# Patient Record
Sex: Female | Born: 1990 | Race: Asian | Hispanic: No | Marital: Single | Smoking: Never smoker
Health system: Southern US, Community
[De-identification: ages and names within clinical notes are randomized; demographics above are authoritative.]

## PROBLEM LIST (undated history)

## (undated) DIAGNOSIS — O0930 Supervision of pregnancy with insufficient antenatal care, unspecified trimester: Secondary | ICD-10-CM

## (undated) DIAGNOSIS — R51 Headache: Secondary | ICD-10-CM

## (undated) HISTORY — PX: NO PAST SURGERIES: SHX2092

## (undated) HISTORY — DX: Supervision of pregnancy with insufficient antenatal care, unspecified trimester: O09.30

## (undated) HISTORY — DX: Headache: R51

---
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
## 2011-01-01 ENCOUNTER — Encounter (HOSPITAL_COMMUNITY): Payer: Self-pay | Admitting: *Deleted

## 2011-01-01 ENCOUNTER — Telehealth (HOSPITAL_COMMUNITY): Payer: Self-pay | Admitting: *Deleted

## 2011-01-01 NOTE — Telephone Encounter (Signed)
Preadmission screen  

## 2011-01-03 ENCOUNTER — Inpatient Hospital Stay (HOSPITAL_COMMUNITY)
Admission: AD | Admit: 2011-01-03 | Discharge: 2011-01-05 | DRG: 775 | Disposition: A | Discharge status: Home / Self Care | Payer: Medicaid Other | Source: Ambulatory Visit | Attending: Obstetrics and Gynecology | Admitting: Obstetrics and Gynecology

## 2011-01-03 ENCOUNTER — Encounter (HOSPITAL_COMMUNITY): Payer: Self-pay | Admitting: Anesthesiology

## 2011-01-03 ENCOUNTER — Inpatient Hospital Stay (HOSPITAL_COMMUNITY): Payer: Medicaid Other | Admitting: Anesthesiology

## 2011-01-03 ENCOUNTER — Encounter (HOSPITAL_COMMUNITY): Payer: Self-pay | Admitting: Obstetrics and Gynecology

## 2011-01-03 ENCOUNTER — Encounter (HOSPITAL_COMMUNITY): Payer: Self-pay

## 2011-01-03 DIAGNOSIS — O99892 Other specified diseases and conditions complicating childbirth: Secondary | ICD-10-CM | POA: Diagnosis present

## 2011-01-03 DIAGNOSIS — Z22330 Carrier of Group B streptococcus: Secondary | ICD-10-CM

## 2011-01-03 DIAGNOSIS — Z3490 Encounter for supervision of normal pregnancy, unspecified, unspecified trimester: Secondary | ICD-10-CM

## 2011-01-03 LAB — CBC
HCT: 35 % — ABNORMAL LOW (ref 36.0–46.0)
Hemoglobin: 12 g/dL (ref 12.0–15.0)
MCV: 83.5 fL (ref 78.0–100.0)
Platelets: 159 10*3/uL (ref 150–400)
RBC: 4.19 MIL/uL (ref 3.87–5.11)
WBC: 17.9 10*3/uL — ABNORMAL HIGH (ref 4.0–10.5)

## 2011-01-03 MED ORDER — MISOPROSTOL 200 MCG PO TABS
ORAL_TABLET | ORAL | Status: AC
  Administered 2011-01-03: 800 ug via RECTAL
  Filled 2011-01-03: qty 4

## 2011-01-03 MED ORDER — ONDANSETRON HCL 4 MG PO TABS: 4.0000 mg | ORAL_TABLET | ORAL | Status: DC | PRN

## 2011-01-03 MED ORDER — PENICILLIN G POTASSIUM 5000000 UNITS IJ SOLR
5.0000 10*6.[IU] | Freq: Once | INTRAVENOUS | Status: AC
  Administered 2011-01-03: 5 10*6.[IU] via INTRAVENOUS
  Filled 2011-01-03: qty 5

## 2011-01-03 MED ORDER — IBUPROFEN 600 MG PO TABS
600.0000 mg | ORAL_TABLET | Freq: Four times a day (QID) | ORAL | Status: DC
  Administered 2011-01-03 – 2011-01-05 (×7): 600 mg via ORAL
  Filled 2011-01-03 (×7): qty 1

## 2011-01-03 MED ORDER — INFLUENZA VIRUS VACC SPLIT PF IM SUSP
0.5000 mL | INTRAMUSCULAR | Status: AC
  Administered 2011-01-04: 0.5 mL via INTRAMUSCULAR
  Filled 2011-01-03: qty 0.5

## 2011-01-03 MED ORDER — OXYTOCIN BOLUS FROM INFUSION
500.0000 mL | Freq: Once | INTRAVENOUS | Status: DC
  Filled 2011-01-03: qty 500

## 2011-01-03 MED ORDER — TETANUS-DIPHTH-ACELL PERTUSSIS 5-2.5-18.5 LF-MCG/0.5 IM SUSP: 0.5000 mL | Freq: Once | INTRAMUSCULAR | Status: DC

## 2011-01-03 MED ORDER — ZOLPIDEM TARTRATE 5 MG PO TABS: 5.0000 mg | ORAL_TABLET | Freq: Every evening | ORAL | Status: DC | PRN

## 2011-01-03 MED ORDER — IBUPROFEN 600 MG PO TABS: 600.0000 mg | ORAL_TABLET | Freq: Four times a day (QID) | ORAL | Status: DC | PRN

## 2011-01-03 MED ORDER — LACTATED RINGERS IV SOLN
INTRAVENOUS | Status: DC
  Administered 2011-01-03: 12:00:00 via INTRAVENOUS

## 2011-01-03 MED ORDER — PRENATAL PLUS 27-1 MG PO TABS
1.0000 | ORAL_TABLET | Freq: Every day | ORAL | Status: DC
  Administered 2011-01-03 – 2011-01-05 (×3): 1 via ORAL
  Filled 2011-01-03 (×3): qty 1

## 2011-01-03 MED ORDER — CITRIC ACID-SODIUM CITRATE 334-500 MG/5ML PO SOLN: 30.0000 mL | ORAL | Status: DC | PRN

## 2011-01-03 MED ORDER — FENTANYL 2.5 MCG/ML BUPIVACAINE 1/10 % EPIDURAL INFUSION (WH - ANES)
INTRAMUSCULAR | Status: DC | PRN
  Administered 2011-01-03: 12:00:00
  Administered 2011-01-03: 14 mL/h via EPIDURAL

## 2011-01-03 MED ORDER — BENZOCAINE-MENTHOL 20-0.5 % EX AERO
INHALATION_SPRAY | CUTANEOUS | Status: AC
  Administered 2011-01-03: 1 via TOPICAL
  Filled 2011-01-03: qty 56

## 2011-01-03 MED ORDER — ONDANSETRON HCL 4 MG/2ML IJ SOLN: 4.0000 mg | INTRAMUSCULAR | Status: DC | PRN

## 2011-01-03 MED ORDER — PENICILLIN G POTASSIUM 5000000 UNITS IJ SOLR
2.5000 10*6.[IU] | INTRAVENOUS | Status: DC
  Administered 2011-01-03: 2.5 10*6.[IU] via INTRAVENOUS
  Filled 2011-01-03 (×5): qty 2.5

## 2011-01-03 MED ORDER — BENZOCAINE-MENTHOL 20-0.5 % EX AERO
1.0000 "application " | INHALATION_SPRAY | CUTANEOUS | Status: DC | PRN
  Administered 2011-01-03: 1 via TOPICAL

## 2011-01-03 MED ORDER — OXYCODONE-ACETAMINOPHEN 5-325 MG PO TABS: 2.0000 | ORAL_TABLET | ORAL | Status: DC | PRN

## 2011-01-03 MED ORDER — ACETAMINOPHEN 325 MG PO TABS: 650.0000 mg | ORAL_TABLET | ORAL | Status: DC | PRN

## 2011-01-03 MED ORDER — FENTANYL 2.5 MCG/ML BUPIVACAINE 1/10 % EPIDURAL INFUSION (WH - ANES)
14.0000 mL/h | INTRAMUSCULAR | Status: DC
  Filled 2011-01-03 (×2): qty 60

## 2011-01-03 MED ORDER — PHENYLEPHRINE 40 MCG/ML (10ML) SYRINGE FOR IV PUSH (FOR BLOOD PRESSURE SUPPORT)
80.0000 ug | PREFILLED_SYRINGE | INTRAVENOUS | Status: DC | PRN
  Filled 2011-01-03: qty 5

## 2011-01-03 MED ORDER — SIMETHICONE 80 MG PO CHEW: 80.0000 mg | CHEWABLE_TABLET | ORAL | Status: DC | PRN

## 2011-01-03 MED ORDER — DIPHENHYDRAMINE HCL 50 MG/ML IJ SOLN: 12.5000 mg | INTRAMUSCULAR | Status: DC | PRN

## 2011-01-03 MED ORDER — EPHEDRINE 5 MG/ML INJ
10.0000 mg | INTRAVENOUS | Status: DC | PRN
  Filled 2011-01-03 (×2): qty 4

## 2011-01-03 MED ORDER — LIDOCAINE HCL 1.5 % IJ SOLN
INTRAMUSCULAR | Status: DC | PRN
  Administered 2011-01-03 (×2): 5 mL via EPIDURAL

## 2011-01-03 MED ORDER — EPHEDRINE 5 MG/ML INJ
10.0000 mg | INTRAVENOUS | Status: DC | PRN
  Filled 2011-01-03: qty 4

## 2011-01-03 MED ORDER — LACTATED RINGERS IV SOLN
INTRAVENOUS | Status: DC
  Administered 2011-01-03: 19:00:00 via INTRAVENOUS

## 2011-01-03 MED ORDER — LANOLIN HYDROUS EX OINT: TOPICAL_OINTMENT | CUTANEOUS | Status: DC | PRN

## 2011-01-03 MED ORDER — LACTATED RINGERS IV SOLN
500.0000 mL | INTRAVENOUS | Status: DC | PRN
  Administered 2011-01-03: 1000 mL via INTRAVENOUS

## 2011-01-03 MED ORDER — PHENYLEPHRINE 40 MCG/ML (10ML) SYRINGE FOR IV PUSH (FOR BLOOD PRESSURE SUPPORT)
80.0000 ug | PREFILLED_SYRINGE | INTRAVENOUS | Status: DC | PRN
  Filled 2011-01-03 (×2): qty 5

## 2011-01-03 MED ORDER — DIBUCAINE 1 % RE OINT: 1.0000 "application " | TOPICAL_OINTMENT | RECTAL | Status: DC | PRN

## 2011-01-03 MED ORDER — ONDANSETRON HCL 4 MG/2ML IJ SOLN: 4.0000 mg | Freq: Four times a day (QID) | INTRAMUSCULAR | Status: DC | PRN

## 2011-01-03 MED ORDER — LIDOCAINE HCL (PF) 1 % IJ SOLN
30.0000 mL | INTRAMUSCULAR | Status: DC | PRN
  Filled 2011-01-03: qty 30

## 2011-01-03 MED ORDER — TERBUTALINE SULFATE 1 MG/ML IJ SOLN: 0.2500 mg | Freq: Once | INTRAMUSCULAR | Status: DC | PRN

## 2011-01-03 MED ORDER — BUTORPHANOL TARTRATE 2 MG/ML IJ SOLN
2.0000 mg | INTRAMUSCULAR | Status: DC | PRN
  Administered 2011-01-03: 2 mg via INTRAVENOUS
  Filled 2011-01-03: qty 1

## 2011-01-03 MED ORDER — LACTATED RINGERS IV SOLN: 500.0000 mL | Freq: Once | INTRAVENOUS | Status: DC

## 2011-01-03 MED ORDER — DIPHENHYDRAMINE HCL 25 MG PO CAPS: 25.0000 mg | ORAL_CAPSULE | Freq: Four times a day (QID) | ORAL | Status: DC | PRN

## 2011-01-03 MED ORDER — OXYCODONE-ACETAMINOPHEN 5-325 MG PO TABS: 1.0000 | ORAL_TABLET | ORAL | Status: DC | PRN

## 2011-01-03 MED ORDER — SENNOSIDES-DOCUSATE SODIUM 8.6-50 MG PO TABS
2.0000 | ORAL_TABLET | Freq: Every day | ORAL | Status: DC
  Administered 2011-01-03: 2 via ORAL

## 2011-01-03 MED ORDER — OXYTOCIN 20 UNITS IN LACTATED RINGERS INFUSION - SIMPLE
1.0000 m[IU]/min | INTRAVENOUS | Status: DC
  Administered 2011-01-03: 2 m[IU]/min via INTRAVENOUS
  Filled 2011-01-03: qty 1000

## 2011-01-03 MED ORDER — FLEET ENEMA 7-19 GM/118ML RE ENEM: 1.0000 | ENEMA | RECTAL | Status: DC | PRN

## 2011-01-03 MED ORDER — WITCH HAZEL-GLYCERIN EX PADS: 1.0000 "application " | MEDICATED_PAD | CUTANEOUS | Status: DC | PRN

## 2011-01-03 MED ORDER — OXYTOCIN 20 UNITS IN LACTATED RINGERS INFUSION - SIMPLE: 125.0000 mL/h | Freq: Once | INTRAVENOUS | Status: DC

## 2011-01-03 NOTE — H&P (Signed)
Sheri [name] is a 20 y.o. female presenting for onset of labor.  Ctx increasing in intensity and frequency.  Prenatal care complicated by early placenta previa, resolved.  +FM, no LOF, no VB, Maternal Medical History:  Reason for admission: Reason for admission: contractions.  Contractions: Onset was 3-5 hours ago.   Frequency: regular.    Fetal activity: Perceived fetal activity is normal.      OB History    Grav Para Term Preterm Abortions TAB SAB Ect Mult Living   1             no pap, no STDs  Past Medical History  Diagnosis Date  . Late prenatal care   . Headache   . Diabetes mellitus     borderline - diet controlled  . Normal pregnancy 01/03/2011   Past Surgical History  Procedure Date  . No past surgeries    Family History: family history is not on file.  She is adopted. Social History:  reports that she has never smoked. She has never used smokeless tobacco. She reports that she does not drink alcohol or use illicit drugs.single All NKDA Meds PNV  Review of Systems  Constitutional: Negative.   HENT: Negative.   Eyes: Negative.   Respiratory: Negative.   Cardiovascular: Negative.   Gastrointestinal: Negative.   Genitourinary: Negative.   Musculoskeletal: Negative.   Skin: Negative.   Neurological: Negative.   Psychiatric/Behavioral: Negative.     Dilation: 3.5 Effacement (%): 70 Station: -1 Exam by:: m early rnc-ob Blood pressure 121/55, pulse 115, temperature 98 F (36.7 C), temperature source Oral, resp. rate 20, height 5\' 3"  (1.6 m), weight 103.42 kg (228 lb), last menstrual period 03/13/2010, SpO2 96.00%. Maternal Exam:  Abdomen: Estimated fetal weight is appropriate for gestation.   Fetal presentation: vertex     Physical Exam  Constitutional: She is oriented to person, place, and time. She appears well-developed and well-nourished.  HENT:  Head: Normocephalic and atraumatic.  Neck: Normal range of motion. Neck supple. No thyromegaly  present.  Cardiovascular: Normal rate and regular rhythm.   Respiratory: Effort normal and breath sounds normal.  GI: Soft. Bowel sounds are normal. There is no tenderness.  Musculoskeletal: Normal range of motion.  Neurological: She is alert and oriented to person, place, and time.  Psychiatric: She has a normal mood and affect.    Prenatal labs: ABO, Rh: A/Positive/-- (06/14 0000) Antibody: Negative (06/14 0000) Rubella: Immune (06/14 0000) RPR: Nonreactive (06/14 0000)  HBsAg: Negative (06/14 0000)  HIV: Non-reactive (06/14 0000)  GBS: Positive (11/19 0000)  Hgb 11.4/ Plt 212K/ GC neg, Chl neg/ First Tri Screen and AFP WNL/ CF neg/ glucola 103 [place] dates at 14 wk, early previa U/S nl anat, except limited face, female, post plac cwd F/u US WNL  Assessment/Plan: 20yo G1P0 at 40+ in early labor Pitocin to augment GBBS +, PCN for prophylaxis AROM after PCN Epidural for comfort Expect SVD   Sheri [name],Sheri [name] 01/03/2011, 9:21 AM

## 2011-01-03 NOTE — Anesthesia Postprocedure Evaluation (Signed)
  Anesthesia Post-op Note  Patient: Sheri [name]  Procedure(s) Performed: * No procedures listed *  Patient Location: PACU and Mother/Baby  Anesthesia Type: Epidural  Level of Consciousness: awake, alert  and oriented  Airway and Oxygen Therapy: Patient Spontanous Breathing   Post-op Assessment: Patient's Cardiovascular Status Stable and Respiratory Function Stable  Post-op Vital Signs: stable  Complications: No apparent anesthesia complications

## 2011-01-03 NOTE — Progress Notes (Signed)
Patient is here for labor eval. She states that she has ctx q3-28mins since 3.30am. She denies any vaginal bleeding or lof. She reports good fetal movement

## 2011-01-03 NOTE — Progress Notes (Signed)
Dr Ellyn Hack returned page and was notified of patient, tracing, ctx pattern, gbs status, patient being uncomfortable and wanting to vomit.

## 2011-01-03 NOTE — Progress Notes (Signed)
Spontaneous del of viable female infant.Marland Kitchenapgars 7/9.

## 2011-01-03 NOTE — Anesthesia Postprocedure Evaluation (Signed)
Anesthesia Post Note  Patient: Sheri [name]  Procedure(s) Performed: * No procedures listed *  Anesthesia type: Epidural  Patient location: Mother/Baby  Post pain: Pain level controlled  Post assessment: Post-op Vital signs reviewed  Last Vitals:  Filed Vitals:   01/03/11 1530  BP: 115/73  Pulse: 136  Temp: 37.7 C  Resp: 20    Post vital signs: Reviewed  Level of consciousness: awake  Complications: No apparent anesthesia complications

## 2011-01-03 NOTE — Anesthesia Preprocedure Evaluation (Signed)
Anesthesia Evaluation  Patient identified by MRN, date of birth, ID band Patient awake    Reviewed: Allergy & Precautions, H&P , NPO status , Patient's Chart, lab work & pertinent test results  Airway Mallampati: II TM Distance: >3 FB Neck ROM: full    Dental No notable dental hx.    Pulmonary neg pulmonary ROS,    Pulmonary exam normal       Cardiovascular neg cardio ROS     Neuro/Psych Negative Psych ROS   GI/Hepatic negative GI ROS, Neg liver ROS,   Endo/Other  Morbid obesity  Renal/GU negative Renal ROS  Genitourinary negative   Musculoskeletal negative musculoskeletal ROS (+)   Abdominal (+) obese,   Peds negative pediatric ROS (+)  Hematology negative hematology ROS (+)   Anesthesia Other Findings   Reproductive/Obstetrics (+) Pregnancy                           Anesthesia Physical Anesthesia Plan  ASA: III  Anesthesia Plan: Epidural   Post-op Pain Management:    Induction:   Airway Management Planned:   Additional Equipment:   Intra-op Plan:   Post-operative Plan:   Informed Consent: I have reviewed the patients History and Physical, chart, labs and discussed the procedure including the risks, benefits and alternatives for the proposed anesthesia with the patient or authorized representative who has indicated his/her understanding and acceptance.     Plan Discussed with:   Anesthesia Plan Comments:         Anesthesia Quick Evaluation  

## 2011-01-03 NOTE — Anesthesia Procedure Notes (Signed)
Epidural Patient location during procedure: OB Start time: 01/03/2011 8:15 AM End time: 01/03/2011 8:22 AM Reason for block: procedure for pain  Staffing Anesthesiologist: Sandrea Hughs Performed by: anesthesiologist   Preanesthetic Checklist Completed: patient identified, site marked, surgical consent, pre-op evaluation, timeout performed, IV checked, risks and benefits discussed and monitors and equipment checked  Epidural Patient position: sitting Prep: site prepped and draped and DuraPrep Patient monitoring: continuous pulse ox and blood pressure Approach: midline Injection technique: LOR air  Needle:  Needle type: Tuohy  Needle gauge: 17 G Needle length: 9 cm Needle insertion depth: 6 cm Catheter type: closed end flexible Catheter size: 19 Gauge Catheter at skin depth: 11 cm Test dose: negative and 1.5% lidocaine  Assessment Sensory level: T8 Events: blood not aspirated, injection not painful, no injection resistance, negative IV test and no paresthesia

## 2011-01-03 NOTE — Progress Notes (Signed)
Sheri [name] is a 20 y.o. G1P0 at [redacted]w[redacted]d admitted for active labor  Subjective: Comfortable with epidural  Objective: BP 133/65  Pulse 105  Temp(Src) 98 F (36.7 C) (Oral)  Resp 20  Ht 5\' 3"  (1.6 m)  Wt 103.42 kg (228 lb)  BMI 40.39 kg/m2  SpO2 96%  LMP 03/13/2010   Total I/O In: -  Out: 200 [Urine:200]  FHT:  FHR: 140-150 bpm, variability: moderate,  accelerations:  Present,  decelerations:  Absent UC:   regular, every 2-4 minutes SVE:   Dilation: 5.6 Effacement (%): 90 Station: 0/+1 Exam by:: Carmelina Noun, MD  Labs: Lab Results  Component Value Date   WBC 17.9* 01/03/2011   HGB 12.0 01/03/2011   HCT 35.0* 01/03/2011   MCV 83.5 01/03/2011   PLT 159 01/03/2011    Assessment / Plan: Spontaneous labor, progressing normally  Labor: Progressing normally Preeclampsia:  no signs or symptoms of toxicity Fetal Wellbeing:  Category II Pain Control:  Epidural I/D:  n/a Anticipated MOD:  NSVD  BOVARD,Meriel Kelliher 01/03/2011, 11:33 AM

## 2011-01-03 NOTE — Addendum Note (Signed)
Addendum  created 01/03/11 1943 by Len Blalock   Modules edited:Charges VN, Notes Section

## 2011-01-04 LAB — CBC
HCT: 24.4 % — ABNORMAL LOW (ref 36.0–46.0)
MCH: 28.2 pg (ref 26.0–34.0)
MCHC: 33.2 g/dL (ref 30.0–36.0)
MCV: 85 fL (ref 78.0–100.0)
RDW: 14.9 % (ref 11.5–15.5)
WBC: 12.3 10*3/uL — ABNORMAL HIGH (ref 4.0–10.5)

## 2011-01-04 NOTE — Progress Notes (Signed)
Post Partum Day 1 Subjective: no complaints, voiding and tolerating PO.  Nl lochia  Objective: Blood pressure 97/55, pulse 103, temperature 97.5 F (36.4 C), temperature source Axillary, resp. rate 20, height 5\' 3"  (1.6 m), weight 103.42 kg (228 lb), last menstrual period 03/13/2010, SpO2 96.00%, unknown if currently breastfeeding.  Physical Exam:  General: alert and no distress Lochia: appropriate Uterine Fundus: firm   Basename 01/04/11 0501 01/03/11 0718  HGB 8.1* 12.0  HCT 24.4* 35.0*    Assessment/Plan: Plan for discharge tomorrow   LOS: 1 day   BOVARD,Coral Soler 01/04/2011, 9:26 AM

## 2011-01-05 ENCOUNTER — Encounter (HOSPITAL_COMMUNITY): Payer: Self-pay | Admitting: *Deleted

## 2011-01-05 MED ORDER — IBUPROFEN 800 MG PO TABS: 800.0000 mg | ORAL_TABLET | Freq: Four times a day (QID) | ORAL | Status: AC

## 2011-01-05 MED ORDER — PRENATAL PLUS 27-1 MG PO TABS: 1.0000 | ORAL_TABLET | Freq: Every day | ORAL | Status: AC

## 2011-01-05 MED ORDER — OXYCODONE-ACETAMINOPHEN 5-325 MG PO TABS: 1.0000 | ORAL_TABLET | ORAL | Status: AC | PRN

## 2011-01-05 NOTE — Discharge Summary (Signed)
Obstetric Discharge Summary Reason for Admission: onset of labor Prenatal Procedures: none Intrapartum Procedures: spontaneous vaginal delivery Postpartum Procedures: none Complications-Operative and Postpartum: 2nd degree perineal laceration Hemoglobin  Date Value Range Status  01/04/2011 8.1* 12.0-15.0 (g/dL) Final     DELTA CHECK NOTED     REPEATED TO VERIFY     HCT  Date Value Range Status  01/04/2011 24.4* 36.0-46.0 (%) Final    Discharge Diagnoses: Term Pregnancy-delivered  Discharge Information: Date: 01/05/2011 Activity: pelvic rest Diet: routine Medications: PNV, Ibuprofen and Percocet Condition: stable Instructions: refer to practice specific booklet Discharge to: home Follow-up Information    Follow up with BOVARD,Sheri Wolman, MD. Make an appointment in 6 weeks.   Contact information:   [address] [facility] [address] [place] [address] [phone]          Newborn Data: Live born female  Birth Weight: 7 lb 15.5 oz (3615 g) APGAR: 7, 9  Home with mother.  BOVARD,Sheri [name] 01/05/2011, 8:49 AM

## 2011-01-05 NOTE — Progress Notes (Signed)
UR chart review completed.  

## 2011-01-05 NOTE — Progress Notes (Signed)
Post Partum Day 2 Subjective: no complaints, up ad lib, voiding and tolerating PO  Objective: Blood pressure 106/72, pulse 103, temperature 98.3 F (36.8 C), temperature source Oral, resp. rate 20, height 5\' 3"  (1.6 m), weight 103.42 kg (228 lb), last menstrual period 03/13/2010, SpO2 96.00%, unknown if currently breastfeeding.  Physical Exam:  General: alert and no distress Lochia: appropriate Uterine Fundus: firm   Basename 01/04/11 0501 01/03/11 0718  HGB 8.1* 12.0  HCT 24.4* 35.0*    Assessment/Plan: Discharge home and Breastfeeding.  D/c home with Motrin/ Percocet/ PNV.  F/u 6 weeks   LOS: 2 days   BOVARD,Shamell Hittle 01/05/2011, 8:45 AM

## 2011-01-06 ENCOUNTER — Inpatient Hospital Stay (HOSPITAL_COMMUNITY): Admission: RE | Admit: 2011-01-06 | Payer: Self-pay | Source: Ambulatory Visit

## 2013-11-20 ENCOUNTER — Encounter (HOSPITAL_COMMUNITY): Payer: Self-pay | Admitting: *Deleted

## 2017-10-12 ENCOUNTER — Encounter (HOSPITAL_COMMUNITY): Payer: Self-pay | Admitting: Emergency Medicine

## 2017-10-12 ENCOUNTER — Emergency Department (HOSPITAL_COMMUNITY)
Admission: EM | Admit: 2017-10-12 | Discharge: 2017-10-12 | Disposition: A | Discharge status: Home / Self Care | Payer: Self-pay | Attending: Emergency Medicine | Admitting: Emergency Medicine

## 2017-10-12 ENCOUNTER — Emergency Department (HOSPITAL_COMMUNITY): Payer: Self-pay

## 2017-10-12 ENCOUNTER — Other Ambulatory Visit: Payer: Self-pay

## 2017-10-12 DIAGNOSIS — E119 Type 2 diabetes mellitus without complications: Secondary | ICD-10-CM | POA: Insufficient documentation

## 2017-10-12 DIAGNOSIS — M25561 Pain in right knee: Secondary | ICD-10-CM | POA: Insufficient documentation

## 2017-10-12 MED ORDER — NAPROXEN 250 MG PO TABS
375.0000 mg | ORAL_TABLET | Freq: Once | ORAL | Status: AC
  Administered 2017-10-12: 375 mg via ORAL
  Filled 2017-10-12: qty 2

## 2017-10-12 MED ORDER — NAPROXEN 375 MG PO TABS: 375.0000 mg | ORAL_TABLET | Freq: Two times a day (BID) | ORAL | 0 refills | Status: AC

## 2017-10-12 NOTE — ED Notes (Signed)
Pt verbalizes understanding of d/c instructions. Prescriptions reviewed with patient. Pt ambulatory at d/c with all belongings and with family.   

## 2017-10-12 NOTE — ED Notes (Signed)
See EDP assessment 
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
## 2017-10-12 NOTE — ED Triage Notes (Signed)
Pt c/o right knee pain after falling while playing kickball last night.

## 2017-10-12 NOTE — ED Provider Notes (Signed)
Patient placed in Quick Look pathway, seen and evaluated   Chief Complaint: Right knee injury  HPI:   Patient states that she was playing kickball yesterday evening when felt her right knee pop.  She reports pain over the lateral aspect of the right knee joint, worse with knee flexion and weight bearing.  No medications prior to arrival.  ROS: No numbness  Physical Exam:   Gen: No distress  Neuro: Awake and Alert  Skin: Warm    Focused Exam: Right knee with tenderness over lateral aspect of the joint. No swelling, erythema, warmth or break in skin. Pain with knee flexion. DP pulses 2+ and symmetric bilaterally. Sensation to light touch intact in bilateral LE.    Initiation of care has begun. The patient has been counseled on the process, plan, and necessity for staying for the completion/evaluation, and the remainder of the medical screening examination    Lawrence MarseillesShrosbree, Ervan Heber J, PA-C 10/12/17 1903    Melene PlanFloyd, Dan, DO 10/12/17 2229

## 2020-04-11 IMAGING — CR DG KNEE COMPLETE 4+V*R*
4 series · 5 of 5 positions shown · non-contrast
Comparison: None.

CLINICAL DATA: Right knee injury.  Heard pop.

EXAM:
RIGHT KNEE - COMPLETE 4+ VIEW

[knee ap]
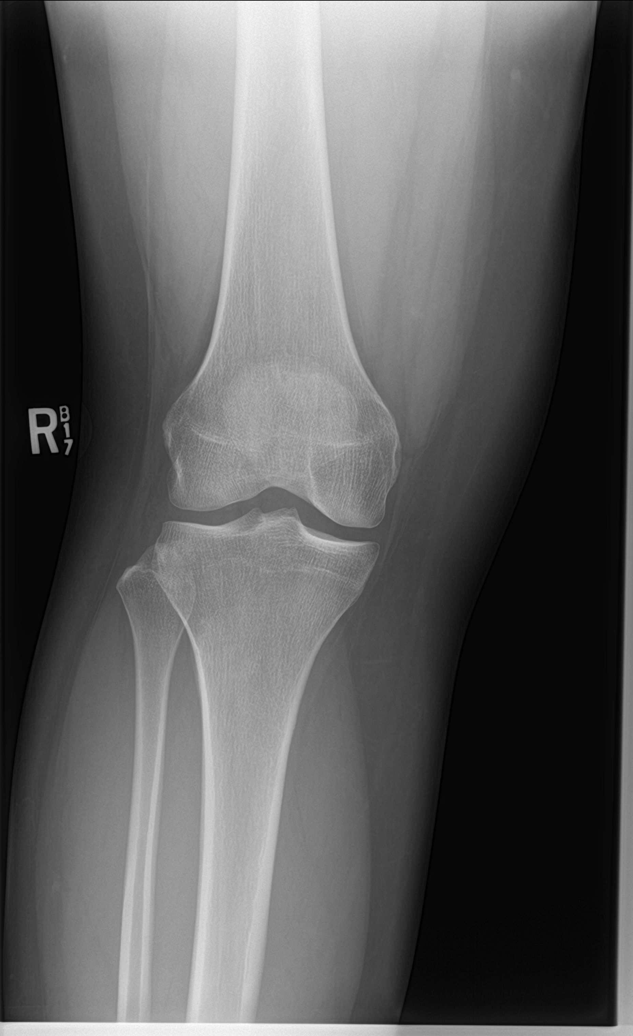

[Series 3: knee lat · 0.14mm/px · 2 of 2 slices shown]
[im 1/2]
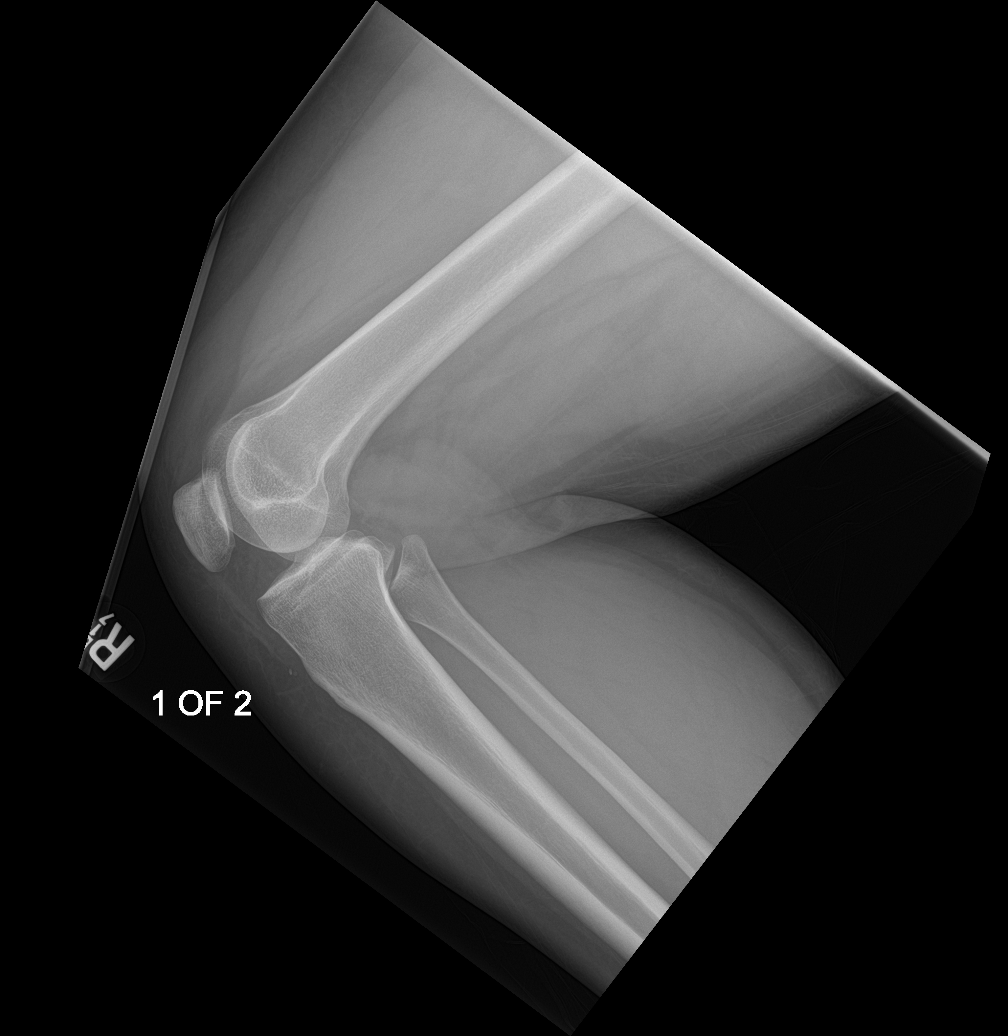
[im 2/2]
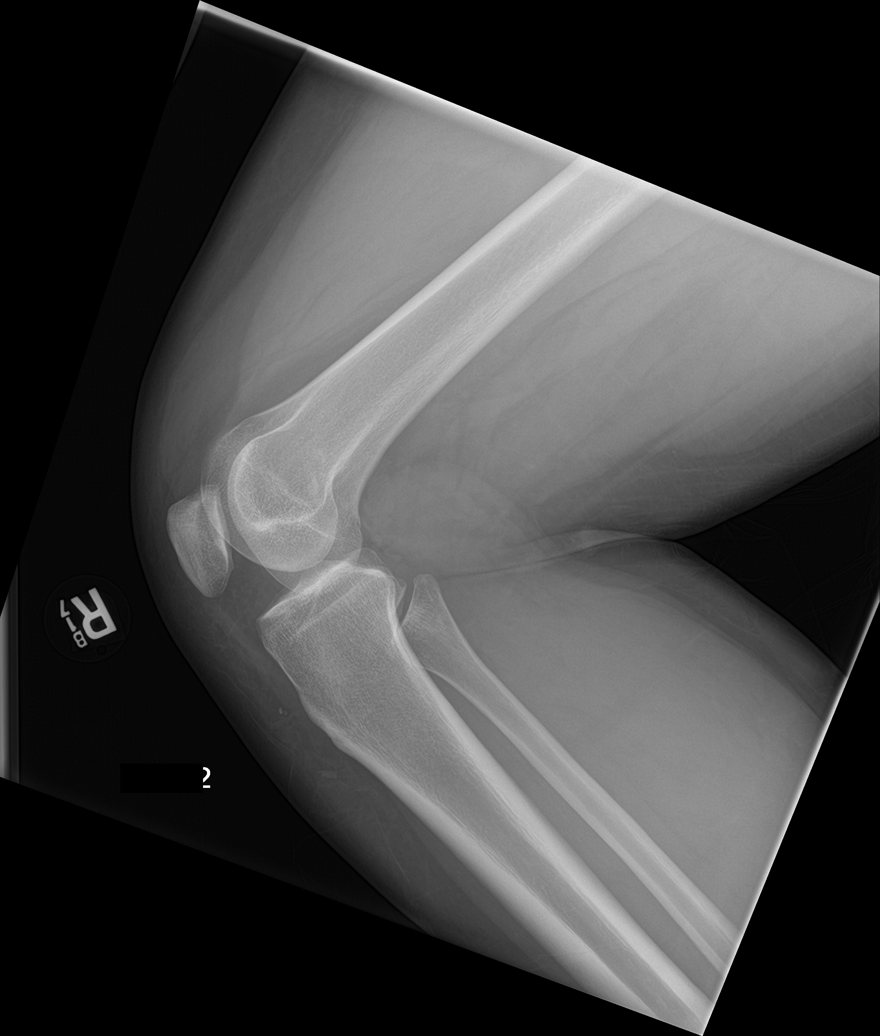

[knee obl (1 of 2)]
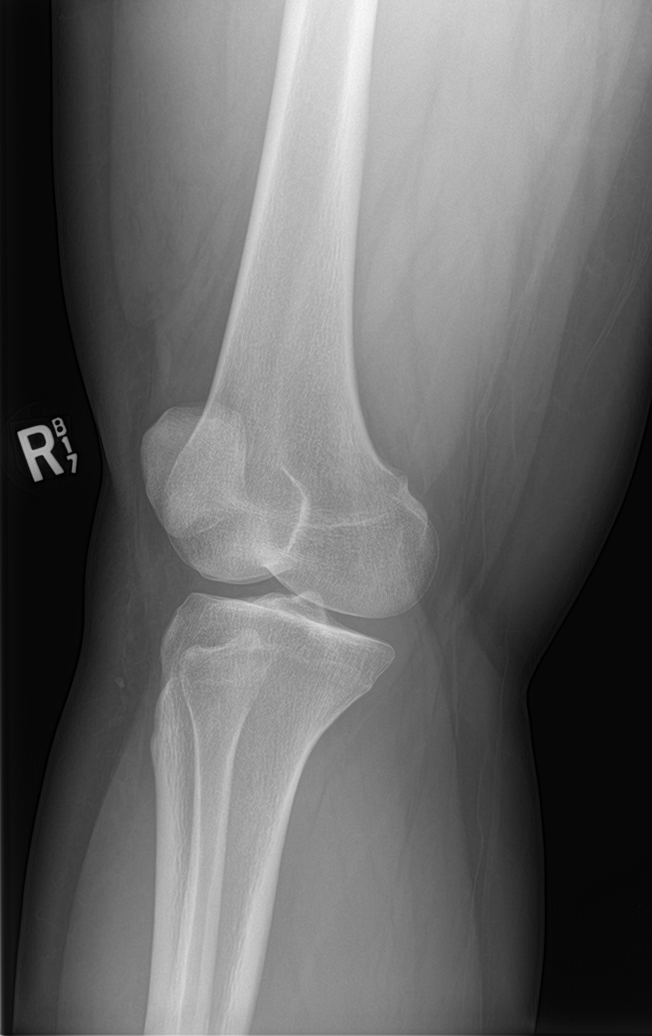

[knee obl (2 of 2)]
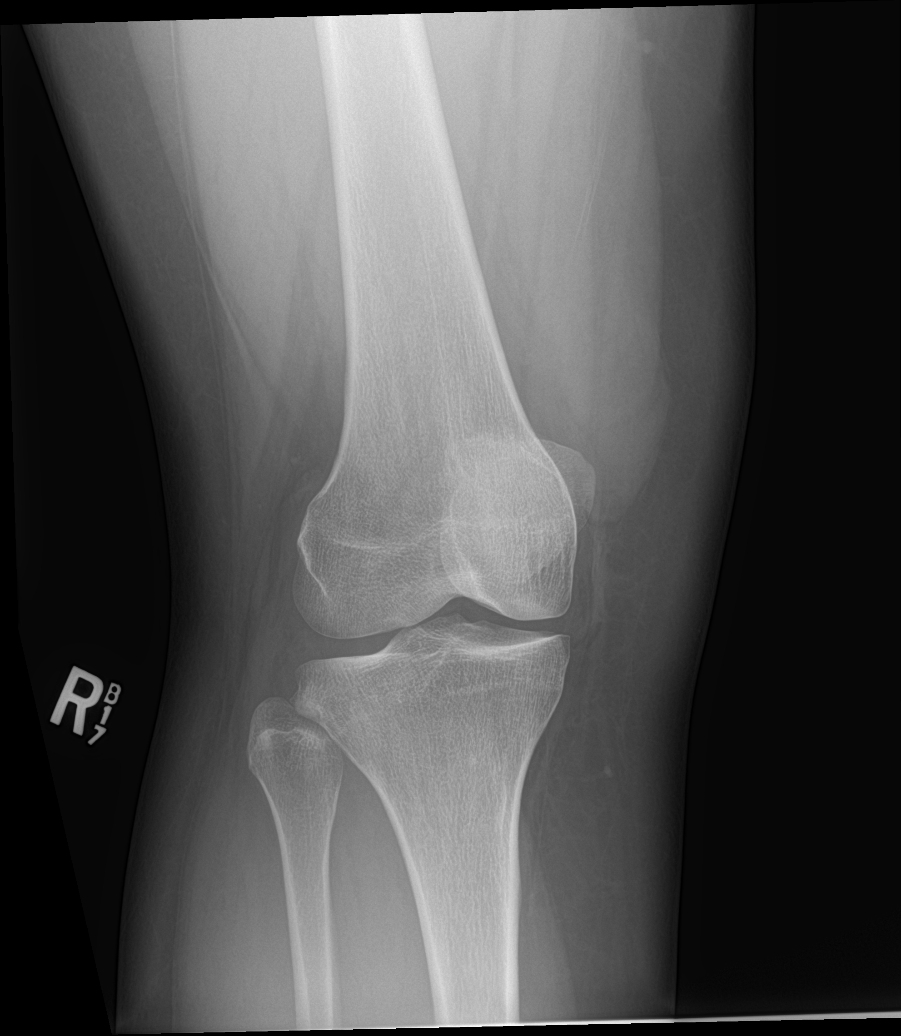

[5 of 5 positions shown; findings below may reference images not displayed]

FINDINGS: No evidence of fracture, dislocation, or joint effusion. No evidence
of arthropathy or other focal bone abnormality. Soft tissues are
unremarkable.
IMPRESSION: Negative.

## 2022-07-19 ENCOUNTER — Other Ambulatory Visit: Payer: Self-pay

## 2022-07-19 ENCOUNTER — Emergency Department (HOSPITAL_COMMUNITY)
Admission: EM | Admit: 2022-07-19 | Discharge: 2022-07-19 | Disposition: A | Discharge status: Home / Self Care | Payer: Self-pay | Attending: Emergency Medicine | Admitting: Emergency Medicine

## 2022-07-19 ENCOUNTER — Encounter (HOSPITAL_COMMUNITY): Payer: Self-pay

## 2022-07-19 ENCOUNTER — Emergency Department (HOSPITAL_COMMUNITY): Payer: Self-pay

## 2022-07-19 DIAGNOSIS — M25562 Pain in left knee: Secondary | ICD-10-CM | POA: Insufficient documentation

## 2022-07-19 DIAGNOSIS — W228XXA Striking against or struck by other objects, initial encounter: Secondary | ICD-10-CM | POA: Insufficient documentation

## 2022-07-19 DIAGNOSIS — G8911 Acute pain due to trauma: Secondary | ICD-10-CM | POA: Insufficient documentation

## 2022-07-19 NOTE — Discharge Instructions (Signed)
It was a pleasure taking care of you!   Your x-ray was negative for fracture or dislocation.  You may take over the counter 600 mg Ibuprofen every 6 hours and alternate with 500 mg Tylenol every 6 hours as needed for pain for no more than 7 days. You will be given a sleeve today. Wear it during the day, you may remove it at night. You may apply ice or heat to affected area for up to 15 minutes at a time. Ensure to place a barrier between your skin and the ice/heat.  Attached is information for the Orthopedist, you may call and set up a follow up appointment regarding todays ED visit. You may follow-up with your primary care provider as needed.  Return to the Emergency Department if you are experiencing increasing/worsening pain, swelling, color change, fever, or worsening symptoms.
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
